# Patient Record
Sex: Female | Born: 1970 | Race: White | Hispanic: No | Smoking: Never smoker
Health system: Southern US, Community
[De-identification: ages and names within clinical notes are randomized; demographics above are authoritative.]

## PROBLEM LIST (undated history)

## (undated) DIAGNOSIS — Z789 Other specified health status: Secondary | ICD-10-CM

## (undated) DIAGNOSIS — E669 Obesity, unspecified: Secondary | ICD-10-CM

## (undated) DIAGNOSIS — T8859XA Other complications of anesthesia, initial encounter: Secondary | ICD-10-CM

## (undated) DIAGNOSIS — T4145XA Adverse effect of unspecified anesthetic, initial encounter: Secondary | ICD-10-CM

## (undated) HISTORY — PX: LAPAROSCOPIC ABDOMINAL EXPLORATION: SHX6249

## (undated) HISTORY — DX: Obesity, unspecified: E66.9

## (undated) HISTORY — PX: ABDOMINAL HYSTERECTOMY: SHX81

---

## 1997-09-02 ENCOUNTER — Inpatient Hospital Stay (HOSPITAL_COMMUNITY): Admission: AD | Admit: 1997-09-02 | Discharge: 1997-09-04 | Payer: Self-pay | Admitting: *Deleted

## 1997-10-07 ENCOUNTER — Other Ambulatory Visit: Admission: RE | Admit: 1997-10-07 | Discharge: 1997-10-07 | Payer: Self-pay | Admitting: *Deleted

## 1999-02-08 ENCOUNTER — Other Ambulatory Visit: Admission: RE | Admit: 1999-02-08 | Discharge: 1999-02-08 | Payer: Self-pay | Admitting: *Deleted

## 1999-08-10 ENCOUNTER — Encounter: Payer: Self-pay | Admitting: Obstetrics and Gynecology

## 1999-08-10 ENCOUNTER — Ambulatory Visit (HOSPITAL_COMMUNITY): Admission: RE | Admit: 1999-08-10 | Discharge: 1999-08-10 | Payer: Self-pay | Admitting: Obstetrics and Gynecology

## 1999-09-03 ENCOUNTER — Inpatient Hospital Stay (HOSPITAL_COMMUNITY): Admission: AD | Admit: 1999-09-03 | Discharge: 1999-09-05 | Payer: Self-pay | Admitting: *Deleted

## 1999-10-18 ENCOUNTER — Other Ambulatory Visit: Admission: RE | Admit: 1999-10-18 | Discharge: 1999-10-18 | Payer: Self-pay | Admitting: *Deleted

## 2000-10-21 ENCOUNTER — Other Ambulatory Visit: Admission: RE | Admit: 2000-10-21 | Discharge: 2000-10-21 | Payer: Self-pay | Admitting: *Deleted

## 2002-10-14 ENCOUNTER — Other Ambulatory Visit: Admission: RE | Admit: 2002-10-14 | Discharge: 2002-10-14 | Payer: Self-pay | Admitting: *Deleted

## 2003-08-24 ENCOUNTER — Other Ambulatory Visit: Admission: RE | Admit: 2003-08-24 | Discharge: 2003-08-24 | Payer: Self-pay | Admitting: Obstetrics & Gynecology

## 2004-07-10 ENCOUNTER — Other Ambulatory Visit: Admission: RE | Admit: 2004-07-10 | Discharge: 2004-07-10 | Payer: Self-pay | Admitting: Obstetrics & Gynecology

## 2004-08-17 ENCOUNTER — Ambulatory Visit (HOSPITAL_COMMUNITY): Admission: RE | Admit: 2004-08-17 | Discharge: 2004-08-17 | Payer: Self-pay | Admitting: Obstetrics & Gynecology

## 2005-05-10 ENCOUNTER — Ambulatory Visit (HOSPITAL_COMMUNITY): Admission: RE | Admit: 2005-05-10 | Discharge: 2005-05-11 | Payer: Self-pay | Admitting: Obstetrics & Gynecology

## 2005-05-10 ENCOUNTER — Encounter (INDEPENDENT_AMBULATORY_CARE_PROVIDER_SITE_OTHER): Payer: Self-pay | Admitting: Specialist

## 2009-12-26 ENCOUNTER — Inpatient Hospital Stay (HOSPITAL_COMMUNITY)
Admission: AD | Admit: 2009-12-26 | Discharge: 2009-12-26 | Payer: Self-pay | Source: Home / Self Care | Admitting: Obstetrics and Gynecology

## 2010-04-12 LAB — CBC
HCT: 36.3 % (ref 36.0–46.0)
Hemoglobin: 12.4 g/dL (ref 12.0–15.0)
MCH: 30.5 pg (ref 26.0–34.0)
MCHC: 34.1 g/dL (ref 30.0–36.0)
MCV: 89.4 fL (ref 78.0–100.0)
Platelets: 541 10*3/uL — ABNORMAL HIGH (ref 150–400)
RBC: 4.06 MIL/uL (ref 3.87–5.11)
RDW: 13.9 % (ref 11.5–15.5)
WBC: 11.2 10*3/uL — ABNORMAL HIGH (ref 4.0–10.5)

## 2010-06-16 NOTE — H&P (Signed)
Jean [name], Jean [name] NO.:  [id number]   MEDICAL RECORD NO.:  [id number]           PATIENT TYPE:   LOCATION:                                FACILITY:  [facility]   PHYSICIAN:  Freddy Finner, M.D.        DATE OF BIRTH:   DATE OF ADMISSION:  DATE OF DISCHARGE:                                HISTORY & PHYSICAL   ADMISSION DIAGNOSES:  1.  Chronic pelvic pain.  2.  Menorrhagia.  3.  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   HISTORY OF PRESENT ILLNESS:  The patient is a 40 year old white female,  gravida 2, para 2, who has been followed in our practice since 1997.  In  1998 she had diagnostic laparoscopy by Dr. Alan Mulder with findings of  pelvic endometriosis which were considered to be mild and were treated with  cautery ablation of the lesions.  She had hydrochrome intubation at the time  with bilateral tubal patency.  Under his direction she eventually conceived  and has carried two children to term and delivered them vaginally.  Within  the last year she has noted significant increasing chronic, primarily left-  sided, pelvic pain and had laparoscopy in July of 2006 with a finding of a  pseudo window at the level of the uterosacral ligament and old scarring  consistent with old endometriosis on the left pelvic sidewall.  These areas  were fulgurated.  In addition she had  uterosacral nerve ablation.  This has  failed to improve her symptoms.  She continues with very heavy, painful  menses and chronic left pelvic pain.  She has requested more definitive  surgical intervention.  She is admitted now for laparoscopically assisted  vaginal hysterectomy.   REVIEW OF SYSTEMS:  Her current review of systems is otherwise negative.   PAST MEDICAL HISTORY:  The patient has no known significant medical  illnesses.  She does have a history of an abnormal Pap smear, but a series  of recent Paps have been normal.   ALLERGIES:  1.  Her medication allergies are to MORPHINE.  2.   PERCOCET.   She has never had a blood transfusion.   PAST SURGICAL HISTORY:  In addition to those noted above, include surgery  for herniated disk in her back in 1991.   FAMILY HISTORY:  Remarkable for type 2 diabetes in her father who is diet  controlled, for a very strong family history of hypercholesterolemia.   PHYSICAL EXAMINATION:  HEENT:  Grossly within normal limits.  VITAL SIGNS:  Blood pressure in the office 108/70, weight 143, height 5 feet  7 inches.  NECK:  Thyroid gland is not palpably enlarged.  CHEST:  Clear to auscultation.  HEART:  Normal sinus rhythm without murmurs, rubs, or gallops.  BREASTS:  Considered to be normal.  No palpable masses.  No nipple  discharge.  No skin change.  ABDOMEN:  Soft and nontender without appreciable organomegaly or palpable  masses.  PELVIC:  External genitalia, vagina, and cervix are normal to inspection.  Most recent Pap test in May of 2006 was  normal.  Bimanual exam reveals the  uterus to be slightly enlarged, boggy to palpation, and mild to moderately  tender.  There are no palpable adnexal masses.  RECTAL:  The rectum is palpably normal.  Rectovaginal exam confirms the  above findings.   ASSESSMENT:  1.  Chronic pelvic pain, primarily left-sided.  2.  Menorrhagia.  3.  Severe dysmenorrhea.  4.  Documented history of pelvic endometriosis.   PLAN:  Laparoscopically assisted vaginal hysterectomy and bilateral salpingo-  oophorectomy only if absolutely essential.      W. Varney Baas, M.D.  Electronically Signed     WRN/MEDQ  D:  05/09/2005  T:  05/09/2005  Job:  [id number]

## 2010-06-16 NOTE — Op Note (Signed)
[name], Jean [name]                ACCOUNT NO.:  [id number]   MEDICAL RECORD NO.:  [id number]          PATIENT TYPE:  AMB   LOCATION:  SDC                           FACILITY:  [facility]   PHYSICIAN:  Freddy Finner, M.D.   DATE OF BIRTH:  1970/09/02   DATE OF PROCEDURE:  08/17/2004  DATE OF DISCHARGE:                                 OPERATIVE REPORT   PREOPERATIVE DIAGNOSES:  Chronic pelvic pain, known previous history of  pelvic endometriosis.   POSTOPERATIVE DIAGNOSES:  Chronic pelvic pain, known previous history of  pelvic endometriosis, minimal evidence of endometriosis, mottled slight  irregularity of uterus, pseudo window of peritoneum and cul-de-sac and along  the left uterosacral (all findings on peritoneum consistent with  endometriosis).   OPERATIVE PROCEDURE:  1.  Diagnostic laparoscopy.  2.  Fulguration of perimeters of pseudo windows peritoneum and pelvis and      scarred endometriotic like lesion of left pelvic side wall just above      the ureter and uterosacral nerve ablation with bipolar coagulation and      uterosacral ligaments.   SURGEON:  Dr. Jennette Kettle   ANESTHESIA:  General.   ESTIMATED INTRAOPERATIVE BLOOD LOSS:  Less than 10 mL.   INTRAOPERATIVE COMPLICATIONS:  None.   Patient is a 40 year old with known pelvic endometriosis diagnosed in 1998  at which time she was evaluated and treated for infertility.  In the recent  past she has had recurrent pelvic pain.  She has requested definitive  diagnosis and intervention.  Is admitted now for laparoscopy.   She was admitted on morning of surgery, brought to the operating room,  placed under adequate general anesthesia, placed in dorsal lithotomy  position.  Prep was carried out with Betadine solution in the usual fashion.  Bladder was evacuated with a Robinson catheter.  Hulka tenaculum was  attached to the cervix.  Sterile drapes were applied.  Two small incisions  were made, one at the umbilicus and one just  above the symphysis.  To the  upper incision an 11 mm nonbladed disposable trocar was introduced  bilaterally in the anterior abdominal wall manually.  Direct inspection  revealed adequate placement with __________  entry.  Pneumoperitoneum was  allowed to accumulate with carbon dioxide gas.  A 5 mm trocar was placed  through the lower incision under direct visualization __________ for  traction and exposure during the procedure.  A bipolar Kleppinger forceps  was then used to fulgurate the perimeter margins of the pseudo windows  identified above.  Also, a lesion along the left pelvic side wall.  Uterosacrals were grasped for retraction with the uterus and fulgurated with  the bipolar forceps.  Inspection of upper abdomen revealed no apparent  abnormality of liver, gallbladder, appendix.  Photographs were made of  intraoperative findings and retained in the office record.  At this point  the procedure was terminated.  Hemostasis was complete.  Gas was allowed to  escape from the abdomen.  Instruments were removed.  Skin incisions were  closed with interrupted subcuticular sutures of 3-0 Dexon.  Marcaine 0.5%  plain was injected through the incision sites for postoperative  analgesia.  The patient was given Toradol 30 mg IV, 30 mg IM at completion  of the procedure.  She was awakened and taken to recovery in good condition.  She will be given routine outpatient surgical instructions.  She was given  Percocet to be taken as needed for postoperative pain.       WRN/MEDQ  D:  08/17/2004  T:  08/17/2004  Job:  [id number]

## 2010-06-16 NOTE — Op Note (Signed)
Jean [name], Jean [name]                ACCOUNT NO.:  [id number]   MEDICAL RECORD NO.:  [id number]          PATIENT TYPE:  AMB   LOCATION:  [facility]                           FACILITY:  [facility]   PHYSICIAN:  Freddy Finner, M.D.   DATE OF BIRTH:  Dec 25, 1970   DATE OF PROCEDURE:  05/10/2005  DATE OF DISCHARGE:                                 OPERATIVE REPORT   PREOPERATIVE DIAGNOSES:  1.  Chronic pelvic pain.  2.  Menorrhagia.  3.  Known previous pelvic endometriosis.  4.  Suspected adenomyosis.   POSTOPERATIVE DIAGNOSES:  1.  Chronic pelvic pain.  2.  Menorrhagia.  3.  Known previous pelvic endometriosis.  4.  Suspected adenomyosis.  5.  No evidence of recurrent endometriosis.   SURGEON:  Dr. Jennette Kettle   ASSISTANT:  Dr. Edward Jolly   ANESTHESIA:  General.   ESTIMATED INTRAOPERATIVE BLOOD LOSS:  200 mL.   INTRAOPERATIVE COMPLICATIONS:  None.   The patient is a 41 year old who had laparoscopic in July of 2006 with the  finding of some recurrent minimal evidence of endometriosis.  She had  uterosacral nerve ablation at that time, has failed to have any significant  clinical improvement in her symptoms which are chronic pelvic pain primarily  on her left and menorrhagia and severe dysmenorrhea.  She is admitted now  for definitive surgery.   She was admitted on the morning of surgery.  She was given an IV bolus of  Rocephin.  She was placed in PAS hose.  She was brought to the operating  room, placed under adequate general anesthesia, placed in the dorsal  lithotomy position using the [place] stirrups system.  Betadine prep of  abdomen, perineum, and vagina was accomplished in the usual fashion using  Betadine scrub followed by Betadine solution.  Bladder evacuated with a  Robinson catheter using sterile technique.  Cervix was visualized and the  Hulka tenaculum attached without difficulty.  Sterile drapes were applied.  Two small incisions were made in the abdomen, one at the umbilicus through  an old scar and one just above the symphysis.  Through the upper incision an  11 mm non-bladed disposable trocar was introduced while elevating the  anterior abdominal wall manually.  Direct inspection with the laparoscope  revealed adequate placement with no evidence of injury on entry.  Pneumoperitoneum was allowed to accumulate with carbon dioxide gas.  A 5 mm  trocar was placed through the lower incision.  A systematic examination of  pelvic and abdominal contents was carried out.  Basically the findings  showed no evidence of recurrent active endometriosis, normal tubes and  ovaries.  The uterus is slightly irregular and a little boggy in appearance  consistent with adenomyosis.  The appendix was normal.  The liver was  normal.  No other abnormalities were noted within the abdominal or pelvic  cavity.  Using a blunt probe for traction through the lower trocar sleeve  and using the gyrus tripolar device at the operating port of the laparoscope  the utero-ovarian ligament, round ligament, and upper broad ligament was  progressively developed, sealed  with bipolar cautery, and sharply divided.  The dissection was carried down to the level just above the uterine  arteries.  Attention was then turned vaginally.  Posterior weighted vaginal  retractor was placed.  Hulka tenaculum was removed.  Christella Hartigan' tenaculum was  used.  Colpotomy incision was made while tenting the mucosa posterior to the  cervix.  Cervix was circumscribed with a scalpel to release the mucosa.  Uterosacral pedicles were then taken with the gyrus Heaney style bipolar  clamp.  Bladder pillars were similarly treated, sealed, and divided.  Bladder was carefully advanced off the cervix and the anterior peritoneum  entered.  Cardinal ligament pedicles and vesical pedicles were taken with  the gyrus device, sealed, and divided.  The uterus was delivered.  Remaining  small peritoneal pedicles were sealed and divided.  Angles of the  vagina  were anchored to the cuff with a mattress suture of 0 Monocryl.  Uterosacrals were plicated and the posterior peritoneum closed with  interrupted 0 Monocryl suture.  Cuff was closed vertically with figure-of-  eights of 0 Monocryl.  Foley catheter was placed.  Re-inspection  laparoscopically was then carried out using the [place] irrigation system  to irrigate the pelvis and to look for bleeding sources.  Several minor  bleeding sources were controlled with bipolar coagulation.  This produced  complete hemostasis as confirmed under irrigation and under reduced intra-  abdominal pressure.  Photographs were made before and after the  hysterectomy.  Those photographs were retained in the office record.  The  gas was then allowed to escape from the abdomen.  The irrigating solution  was aspirated from the abdomen, the instruments removed.  The skin incisions  were closed with interrupted subcuticular sutures of 3-0 Dexon.  Steri-  Strips applied to the lower incision.  0.5% Marcaine was injected through  the incision site for postoperative analgesia.  The patient was awakened,  taken to the recovery room in good condition.      Freddy Finner, M.D.  Electronically Signed     WRN/MEDQ  D:  05/10/2005  T:  05/10/2005  Job:  [id number]

## 2010-06-16 NOTE — Discharge Summary (Signed)
[name], [name]                ACCOUNT NO.:  [id number]   MEDICAL RECORD NO.:  [id number]          PATIENT TYPE:  OIB   LOCATION:  9317                          FACILITY:  [facility]   PHYSICIAN:  Freddy Finner, M.D.   DATE OF BIRTH:  06-29-1970   DATE OF ADMISSION:  05/10/2005  DATE OF DISCHARGE:  05/11/2005                                 DISCHARGE SUMMARY   DISCHARGE DIAGNOSIS:  Clinical symptoms of chronic pelvic pain, menorrhagia,  severe dysmenorrhea, histologic diagnosis of uterine adenomyosis, previously  documented but no evidence of recurrent endometriosis.   PROCEDURE:  Laparoscopic-assisted vaginal hysterectomy.   COMPLICATIONS:  There were no operative or postoperative complications.   LABORATORY DATA:  Hemoglobin 14.8 on admission and white count of 7500.  Admission prothrombin time, PTT, and INR were all normal.  Postoperative  hemoglobin was 12.6 and white count of 8.5.  Admission urinalysis was  normal.   HISTORY OF PRESENT ILLNESS:  For details of the present illness, past  history, family history, review of systems, and physical exam, are as  recorded in the admission note and are in the operative summary.  The  patient was admitted on the morning of surgery.  Her physical findings were  most remarkable for ultrasound findings consistent with adenomyosis and  clinical history consistent with this.  The uterus was slightly enlarged and  slightly tender by manual exam.   HOSPITAL COURSE:  The patient was admitted on the morning of surgery.  She  was given an IV antibiotics, placed in PAS hose, brought to the operating  room, and placed under adequate general anesthesia where the above-described  procedure was accomplished without difficulty.  There were no intraoperative  or postoperative complications.   By the evening of the first postoperative day, she was ambulating without  difficulty, having adequate bowel and bladder function.  She was discharged  home  with progressively increasing physical activity, with Percocet to be  taken as needed for postoperative pain.  She is to avoid vaginal entry or  heavy lifting.  She is to report heavy bleeding or fever.  She is to return  to the office in approximately two weeks for postoperative followup.      Freddy Finner, M.D.  Electronically Signed     WRN/MEDQ  D:  06/17/2005  T:  06/18/2005  Job:  [id number]

## 2012-08-25 ENCOUNTER — Other Ambulatory Visit: Payer: Self-pay | Admitting: Obstetrics & Gynecology

## 2012-08-25 DIAGNOSIS — R109 Unspecified abdominal pain: Secondary | ICD-10-CM

## 2012-08-26 ENCOUNTER — Ambulatory Visit
Admission: RE | Admit: 2012-08-26 | Discharge: 2012-08-26 | Disposition: A | Discharge status: Home / Self Care | Payer: Managed Care, Other (non HMO) | Source: Ambulatory Visit | Attending: Obstetrics & Gynecology | Admitting: Obstetrics & Gynecology

## 2012-08-26 DIAGNOSIS — R109 Unspecified abdominal pain: Secondary | ICD-10-CM

## 2012-08-26 MED ORDER — IOHEXOL 300 MG/ML  SOLN
100.0000 mL | Freq: Once | INTRAMUSCULAR | Status: AC | PRN
  Administered 2012-08-26: 100 mL via INTRAVENOUS

## 2012-08-27 ENCOUNTER — Other Ambulatory Visit: Payer: Self-pay

## 2013-04-22 ENCOUNTER — Inpatient Hospital Stay (HOSPITAL_COMMUNITY)
Admission: AD | Admit: 2013-04-22 | Discharge: 2013-04-22 | Disposition: A | Discharge status: Home / Self Care | Payer: Managed Care, Other (non HMO) | Source: Ambulatory Visit | Attending: Obstetrics and Gynecology | Admitting: Obstetrics and Gynecology

## 2013-04-22 ENCOUNTER — Encounter (HOSPITAL_COMMUNITY): Payer: Self-pay | Admitting: *Deleted

## 2013-04-22 DIAGNOSIS — N949 Unspecified condition associated with female genital organs and menstrual cycle: Secondary | ICD-10-CM | POA: Insufficient documentation

## 2013-04-22 DIAGNOSIS — N390 Urinary tract infection, site not specified: Secondary | ICD-10-CM

## 2013-04-22 DIAGNOSIS — M545 Low back pain, unspecified: Secondary | ICD-10-CM | POA: Insufficient documentation

## 2013-04-22 DIAGNOSIS — R319 Hematuria, unspecified: Secondary | ICD-10-CM | POA: Insufficient documentation

## 2013-04-22 HISTORY — DX: Other specified health status: Z78.9

## 2013-04-22 HISTORY — DX: Other complications of anesthesia, initial encounter: T88.59XA

## 2013-04-22 HISTORY — DX: Adverse effect of unspecified anesthetic, initial encounter: T41.45XA

## 2013-04-22 LAB — URINALYSIS, ROUTINE W REFLEX MICROSCOPIC
BILIRUBIN URINE: NEGATIVE
GLUCOSE, UA: 250 mg/dL — AB
KETONES UR: 15 mg/dL — AB
LEUKOCYTES UA: NEGATIVE
Nitrite: POSITIVE — AB
PH: 5.5 (ref 5.0–8.0)
Protein, ur: >300 mg/dL — AB
SPECIFIC GRAVITY, URINE: 1.02 (ref 1.005–1.030)
Urobilinogen, UA: >8 mg/dL — ABNORMAL HIGH (ref 0.0–1.0)

## 2013-04-22 LAB — URINE MICROSCOPIC-ADD ON

## 2013-04-22 MED ORDER — FLUCONAZOLE 150 MG PO TABS: 150.0000 mg | ORAL_TABLET | Freq: Every day | ORAL | Status: AC

## 2013-04-22 MED ORDER — PHENAZOPYRIDINE HCL 200 MG PO TABS: 200.0000 mg | ORAL_TABLET | Freq: Three times a day (TID) | ORAL | Status: AC | PRN

## 2013-04-22 MED ORDER — CIPROFLOXACIN HCL 500 MG PO TABS: 500.0000 mg | ORAL_TABLET | Freq: Two times a day (BID) | ORAL | Status: AC

## 2013-04-22 MED ORDER — PHENAZOPYRIDINE HCL 100 MG PO TABS
200.0000 mg | ORAL_TABLET | Freq: Once | ORAL | Status: AC
  Administered 2013-04-22: 200 mg via ORAL
  Filled 2013-04-22: qty 2

## 2013-04-22 MED ORDER — CIPROFLOXACIN HCL 500 MG PO TABS
500.0000 mg | ORAL_TABLET | Freq: Once | ORAL | Status: AC
  Administered 2013-04-22: 500 mg via ORAL
  Filled 2013-04-22: qty 1

## 2013-04-22 NOTE — Discharge Instructions (Signed)
Urinary Tract Infection  Urinary tract infections (UTIs) can develop anywhere along your urinary tract. Your urinary tract is your body's drainage system for removing wastes and extra water. Your urinary tract includes two kidneys, two ureters, a bladder, and a urethra. Your kidneys are a pair of bean-shaped organs. Each kidney is about the size of your fist. They are located below your ribs, one on each side of your spine.  CAUSES  Infections are caused by microbes, which are microscopic organisms, including fungi, viruses, and bacteria. These organisms are so small that they can only be seen through a microscope. Bacteria are the microbes that most commonly cause UTIs.  SYMPTOMS   Symptoms of UTIs may vary by age and gender of the patient and by the location of the infection. Symptoms in young women typically include a frequent and intense urge to urinate and a painful, burning feeling in the bladder or urethra during urination. Older women and men are more likely to be tired, shaky, and weak and have muscle aches and abdominal pain. A fever may mean the infection is in your kidneys. Other symptoms of a kidney infection include pain in your back or sides below the ribs, nausea, and vomiting.  DIAGNOSIS  To diagnose a UTI, your caregiver will ask you about your symptoms. Your caregiver also will ask to provide a urine sample. The urine sample will be tested for bacteria and white blood cells. White blood cells are made by your body to help fight infection.  TREATMENT   Typically, UTIs can be treated with medication. Because most UTIs are caused by a bacterial infection, they usually can be treated with the use of antibiotics. The choice of antibiotic and length of treatment depend on your symptoms and the type of bacteria causing your infection.  HOME CARE INSTRUCTIONS   If you were prescribed antibiotics, take them exactly as your caregiver instructs you. Finish the medication even if you feel better after you  have only taken some of the medication.   Drink enough water and fluids to keep your urine clear or pale yellow.   Avoid caffeine, tea, and carbonated beverages. They tend to irritate your bladder.   Empty your bladder often. Avoid holding urine for long periods of time.   Empty your bladder before and after sexual intercourse.   After a bowel movement, women should cleanse from front to back. Use each tissue only once.  SEEK MEDICAL CARE IF:    You have back pain.   You develop a fever.   Your symptoms do not begin to resolve within 3 days.  SEEK IMMEDIATE MEDICAL CARE IF:    You have severe back pain or lower abdominal pain.   You develop chills.   You have nausea or vomiting.   You have continued burning or discomfort with urination.  MAKE SURE YOU:    Understand these instructions.   Will watch your condition.   Will get help right away if you are not doing well or get worse.  Document Released: 10/25/2004 Document Revised: 07/17/2011 Document Reviewed: 02/23/2011  [employer] Patient Information 2014 [employer]

## 2013-04-22 NOTE — MAU Provider Note (Signed)
History     CSN: [id number]  Arrival date and time: 04/22/13 2200   First Provider Initiated Contact with Patient 04/22/13 2255      Chief Complaint  Patient presents with  . Urinary Tract Infection  . Hematuria   HPI Ms. Jean [name] is a 43 y.o. female who presents to MAU today with complaint of worsening UTI symptoms and hematuria. The patient was diagnosed with a UTI in the office and started on antibiotics on Tuesday. She states that pelvic pain and urinary complaints have worsened since then. She is on Macrobid and taking AZO tabs. She is having pelvic pain, low back pain, dysuria, frequency, urgency and hematuria. She denies flank pain or fever.    OB History   Grav Para Term Preterm Abortions TAB SAB Ect Mult Living                  Past Medical History  Diagnosis Date  . Medical history non-contributory   . Complication of anesthesia     Past Surgical History  Procedure Laterality Date  . Abdominal hysterectomy    . Laparoscopic abdominal exploration      Family History  Problem Relation Age of Onset  . Diabetes Father   . Hypertension Father     History  Substance Use Topics  . Smoking status: Never Smoker   . Smokeless tobacco: Not on file  . Alcohol Use: No    Allergies: Allergies no known allergies  Prescriptions prior to admission  Medication Sig Dispense Refill  . nitrofurantoin, macrocrystal-monohydrate, (MACROBID) 100 MG capsule Take 100 mg by mouth 2 (two) times daily.      . phenazopyridine (PYRIDIUM) 95 MG tablet Take 95 mg by mouth 3 (three) times daily as needed for pain.        Review of Systems  Constitutional: Positive for chills. Negative for fever and malaise/fatigue.  Gastrointestinal: Positive for abdominal pain.  Genitourinary: Positive for dysuria, urgency, frequency and hematuria. Negative for flank pain.   Physical Exam   Blood pressure 130/85, pulse 101, temperature 97.8 F (36.6 C), temperature source Oral, resp.  rate 16, height 5' 6.6" (1.692 m), weight 140 lb 3.2 oz (63.594 kg).  Physical Exam  Constitutional: She is oriented to person, place, and time. She appears well-developed and well-nourished. No distress.  HENT:  Head: Normocephalic and atraumatic.  Cardiovascular: Normal rate.   Respiratory: Effort normal.  GI: Soft. She exhibits no distension and no mass. There is tenderness (moderate tenderness to palpation of the lower abdomen). There is no rebound, no guarding and no CVA tenderness.  Neurological: She is alert and oriented to person, place, and time.  Skin: Skin is warm and dry. No erythema.  Psychiatric: She has a normal mood and affect.   Results for orders placed during the hospital encounter of 04/22/13 (from the past 24 hour(s))  URINALYSIS, ROUTINE W REFLEX MICROSCOPIC     Status: Abnormal   Collection Time    04/22/13 10:05 PM      Result Value Ref Range   Color, Urine ORANGE (*) YELLOW   APPearance CLOUDY (*) CLEAR   Specific Gravity, Urine 1.020  1.005 - 1.030   pH 5.5  5.0 - 8.0   Glucose, UA 250 (*) NEGATIVE mg/dL   Hgb urine dipstick LARGE (*) NEGATIVE   Bilirubin Urine NEGATIVE  NEGATIVE   Ketones, ur 15 (*) NEGATIVE mg/dL   Protein, ur >409>300 (*) NEGATIVE mg/dL   Urobilinogen, UA >8.1>8.0 (*) 0.0 -  1.0 mg/dL   Nitrite POSITIVE (*) NEGATIVE   Leukocytes, UA NEGATIVE  NEGATIVE  URINE MICROSCOPIC-ADD ON     Status: None   Collection Time    04/22/13 10:05 PM      Result Value Ref Range   Squamous Epithelial / LPF RARE  RARE   WBC, UA 0-2  <3 WBC/hpf   RBC / HPF 21-50  <3 RBC/hpf   Bacteria, UA RARE  RARE    MAU Course  Procedures None  MDM Discussed with Dr. Thana Ates. Discharge with Rx for Cipro and Pyridium. Give first dose in MAU tonight.  Requests Diflucan. Usually get yeast after abx Assessment and Plan  A: UTI  P: Discharge home Fx for Cipro, Pyridium and Diflucan given Warning signs for pyelonephritis discussed Patient to follow-up in the office  PRN Patient may return to MAU as needed or if her condition were to change or worsen  Freddi Starr, PA-C  04/22/2013, 10:55 PM

## 2013-04-22 NOTE — MAU Note (Signed)
Pt started taking macrobid and AZO for UTI on 3/24, no relief.  Today noticed blood clots in urine. Hx of partial hysterectomy.

## 2013-04-24 LAB — URINE CULTURE
CULTURE: NO GROWTH
Colony Count: NO GROWTH

## 2013-12-02 ENCOUNTER — Other Ambulatory Visit: Payer: Self-pay | Admitting: Obstetrics & Gynecology

## 2013-12-03 LAB — CYTOLOGY - PAP

## 2019-05-11 ENCOUNTER — Ambulatory Visit: Payer: Managed Care, Other (non HMO) | Admitting: Dermatology

## 2019-07-14 ENCOUNTER — Ambulatory Visit: Payer: BC Managed Care – PPO | Admitting: Dermatology

## 2019-07-14 ENCOUNTER — Other Ambulatory Visit: Payer: Self-pay

## 2019-07-14 DIAGNOSIS — L820 Inflamed seborrheic keratosis: Secondary | ICD-10-CM | POA: Diagnosis not present

## 2019-07-14 DIAGNOSIS — L578 Other skin changes due to chronic exposure to nonionizing radiation: Secondary | ICD-10-CM

## 2019-07-14 DIAGNOSIS — L219 Seborrheic dermatitis, unspecified: Secondary | ICD-10-CM

## 2019-07-14 DIAGNOSIS — L821 Other seborrheic keratosis: Secondary | ICD-10-CM | POA: Diagnosis not present

## 2019-07-14 NOTE — Patient Instructions (Signed)
Recommend daily broad spectrum sunscreen SPF 30+ to sun-exposed areas, reapply every 2 hours as needed. Call for new or changing lesions.  Cryotherapy Aftercare  . Wash gently with soap and water everyday.   . Apply Vaseline and Band-Aid daily until healed.  

## 2019-07-14 NOTE — Progress Notes (Signed)
   Follow-Up Visit   Subjective  Jean [name] is a 49 y.o. female who presents for the following: Skin Problem.  Patient here today for a spot on left lower leg. Present for about 3-4 months, she would cut it when shaving and it would bleed then scab. There is just a white spot there now and occasionally itches. There is also a few spots on her feet and legs she would like checked. The spot on her R foot itches.  She also has another irritated spot on her R arm.  No history of skin cancer.  Patient is also using TMC 0.1% cream for scaly rash of the R ear.  Rash on ear has improved.  The following portions of the chart were reviewed this encounter and updated as appropriate:      Review of Systems:  No other skin or systemic complaints except as noted in HPI or Assessment and Plan.  Objective  Well appearing patient in no apparent distress; mood and affect are within normal limits.  A focused examination was performed including feet, legs, arms, ears. Relevant physical exam findings are noted in the Assessment and Plan.  Objective  right foot dorsum x 1, right flexor forearm x 1 (2): Erythematous keratotic waxy stuck-on papule  Objective  Left pretibia, left mid pretibia: Waxy flesh papule on left pretibia, 2.5cm waxy tan patch on left mid pretibia  Objective  Right Ear anithelix: Pink patch with mild scale- improved per pt, she preferentially sleeps on R side   Assessment & Plan    Inflamed seborrheic keratosis (2) right foot dorsum x 1, right flexor forearm x 1  Cryotherapy today   Destruction of lesion - right foot dorsum x 1, right flexor forearm x 1  Destruction method: cryotherapy   Informed consent: discussed and consent obtained   Lesion destroyed using liquid nitrogen: Yes   Region frozen until ice ball extended beyond lesion: Yes   Outcome: patient tolerated procedure well with no complications   Post-procedure details: wound care instructions given     Seborrheic keratosis Left pretibia, left mid pretibia  Benign, observe.    Seborrheic dermatitis Right Ear anithelix  Vrs CD[facility]- improved Cont TMC 0.1% cream QD/BID PRN flare rash/itch. Avoid F/G/A. Advised not to use once resolved. May also try avoiding pressure on ear while sleeping   Actinic Damage - diffuse scaly erythematous macules with underlying dyspigmentation - Recommend daily broad spectrum sunscreen SPF 30+ to sun-exposed areas, reapply every 2 hours as needed.  - Call for new or changing lesions.  Return if symptoms worsen or fail to improve.  Anise Salvo, RMA, am acting as scribe for Willeen Niece, MD .  Documentation: I have reviewed the above documentation for accuracy and completeness, and I agree with the above.  Willeen Niece MD

## 2020-07-14 ENCOUNTER — Other Ambulatory Visit: Payer: Self-pay | Admitting: Obstetrics & Gynecology

## 2020-07-14 DIAGNOSIS — R928 Other abnormal and inconclusive findings on diagnostic imaging of breast: Secondary | ICD-10-CM

## 2020-07-15 ENCOUNTER — Ambulatory Visit
Admission: RE | Admit: 2020-07-15 | Discharge: 2020-07-15 | Disposition: A | Discharge status: Home / Self Care | Payer: BC Managed Care – PPO | Source: Ambulatory Visit | Attending: Obstetrics & Gynecology | Admitting: Obstetrics & Gynecology

## 2020-07-15 ENCOUNTER — Other Ambulatory Visit: Payer: Self-pay

## 2020-07-15 DIAGNOSIS — R928 Other abnormal and inconclusive findings on diagnostic imaging of breast: Secondary | ICD-10-CM

## 2021-03-27 ENCOUNTER — Other Ambulatory Visit: Payer: Self-pay | Admitting: Obstetrics and Gynecology

## 2021-10-18 IMAGING — MG MM DIGITAL DIAGNOSTIC UNILAT*R* W/ TOMO W/ CAD
6 series · 6 of 18 positions shown · non-contrast
Comparison: Previous exam(s).

CLINICAL DATA: 50-year-old female presenting as a recall from
screening for possible right breast asymmetry.

EXAM:
DIGITAL DIAGNOSTIC UNILATERAL RIGHT MAMMOGRAM WITH TOMOSYNTHESIS AND
CAD; ULTRASOUND RIGHT BREAST LIMITED
TECHNIQUE: Right digital diagnostic mammography and breast tomosynthesis was
performed. The images were evaluated with computer-aided detection.;
Targeted ultrasound examination of the right breast was performed

[R CC synth-2D (1 of 2)]
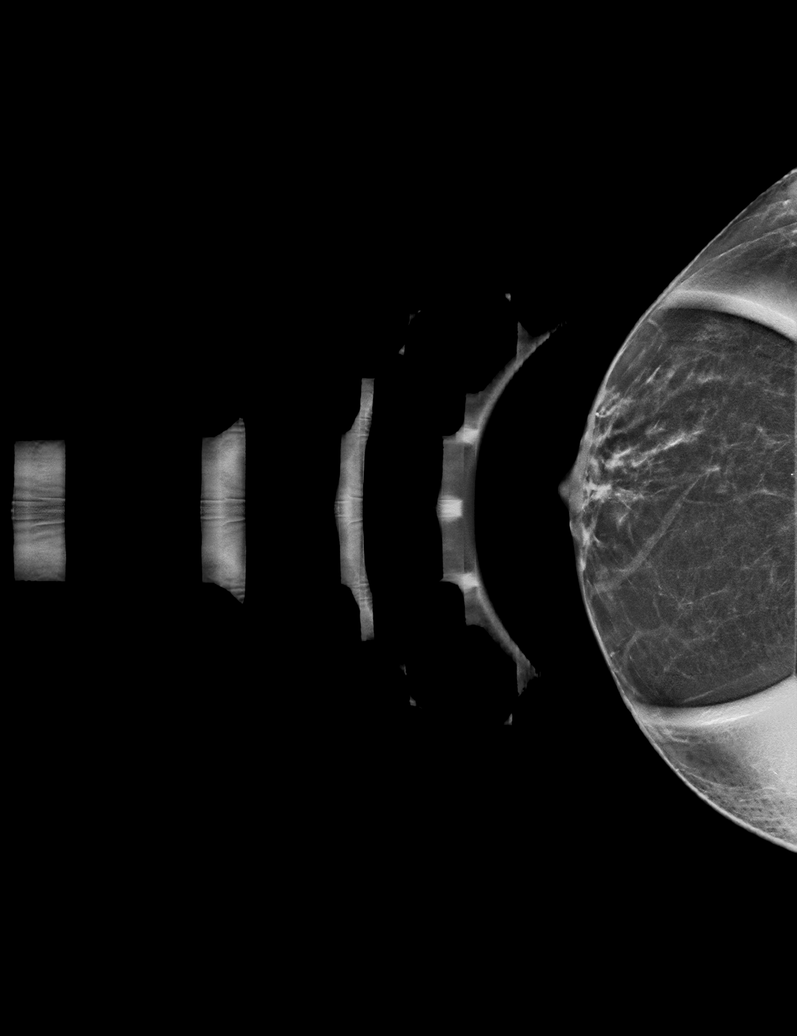

[R CC synth-2D (2 of 2)]
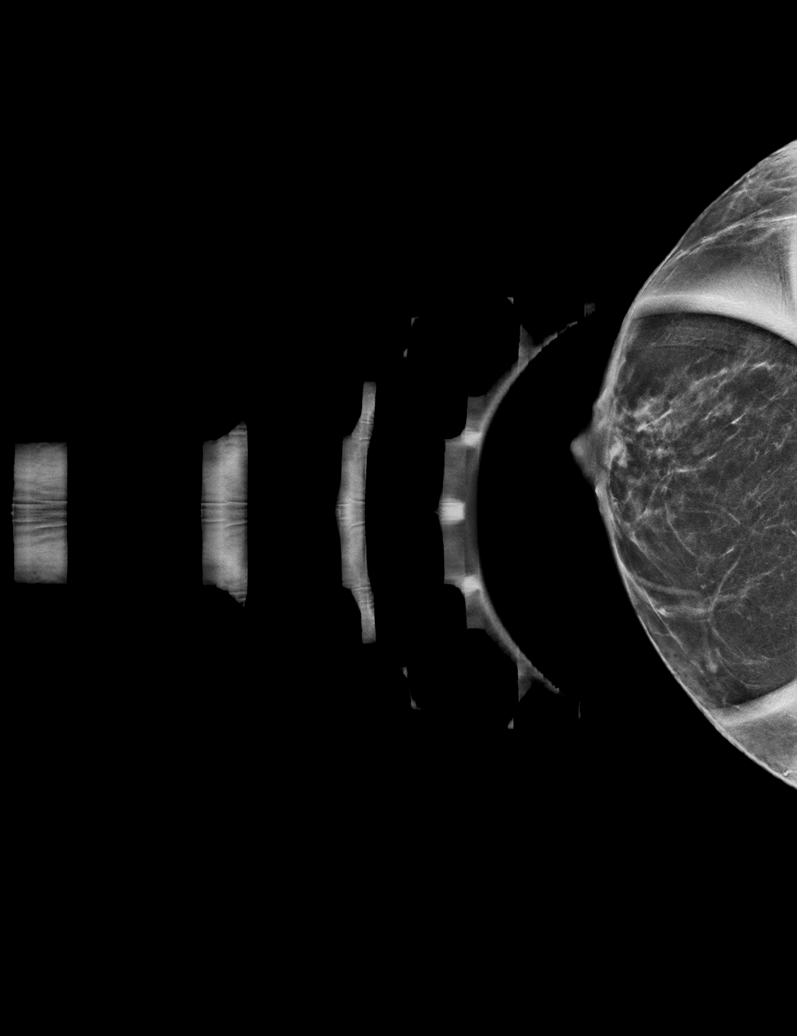

[R ML synth-2D]
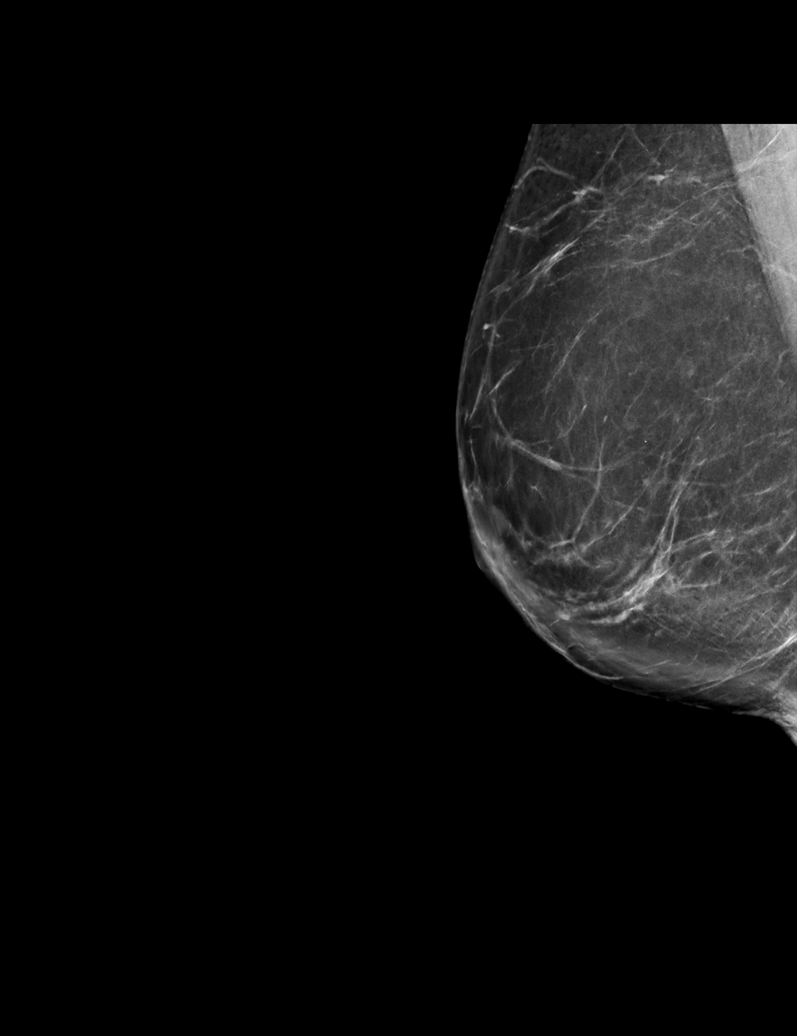

[R ML tomo · tomo slice 41/82.0]
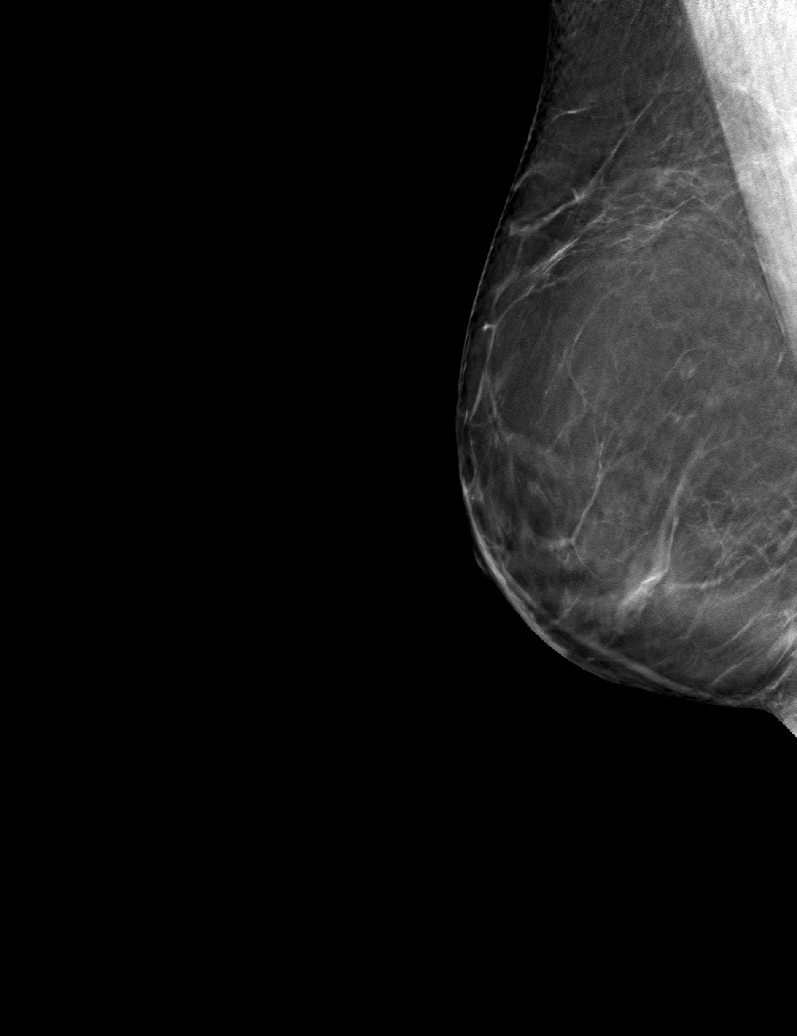

[R CC tomo (1 of 2) · tomo slice 25/49.0]
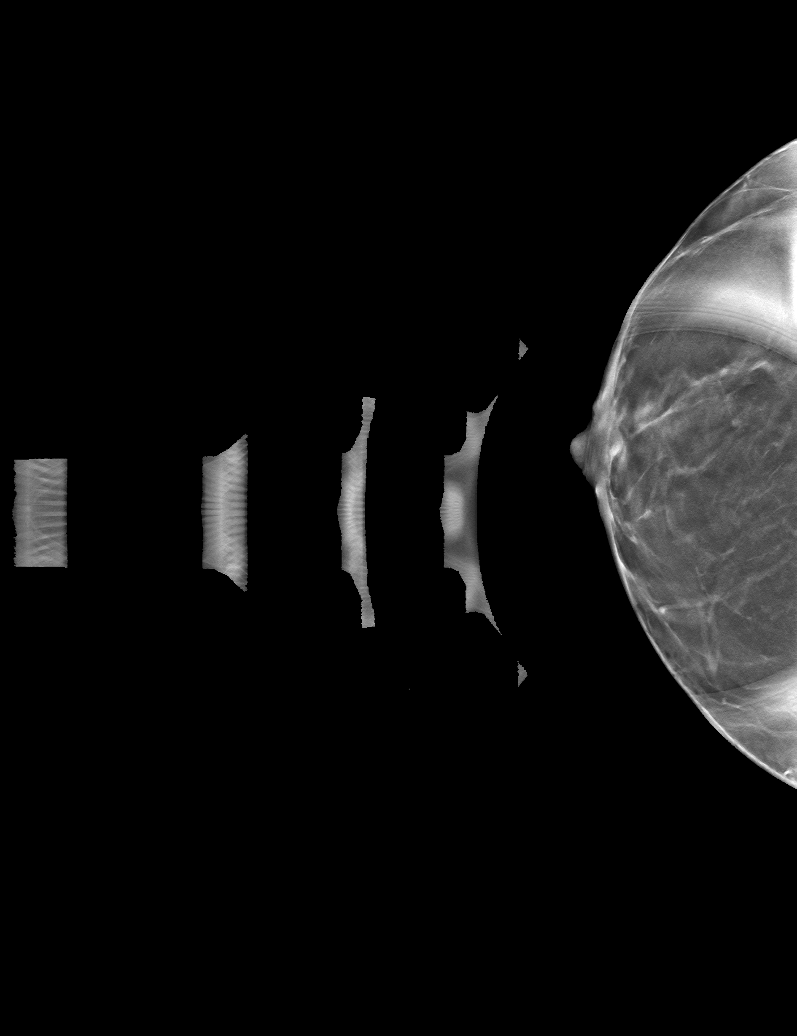

[R CC tomo (2 of 2) · tomo slice 25/49.0]
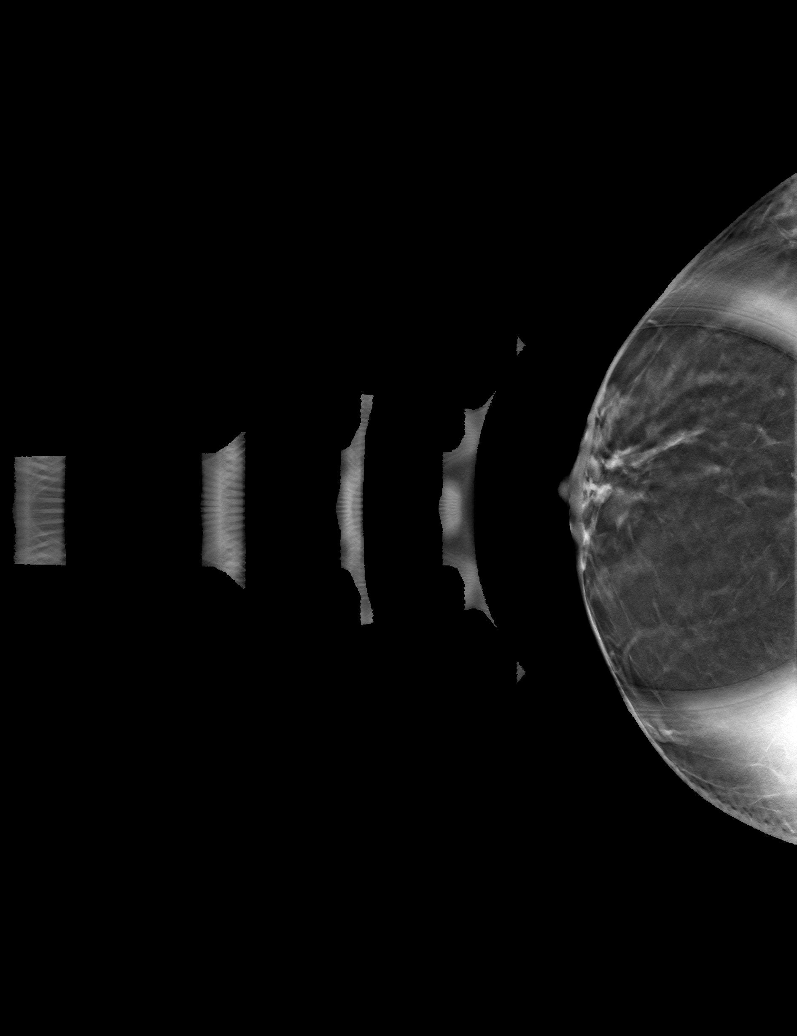

[6 of 18 positions shown; findings below may reference images not displayed]

ACR Breast Density Category b: There are scattered areas of
fibroglandular density.
FINDINGS: Mammogram:

Spot compression tomosynthesis cc, rolled spot compression
tomosynthesis cc and full field mL tomosynthesis views of the right
breast were performed. There is a persistent small
asymmetry/possible mass in the retroareolar right breast measuring
0.7 cm.

Ultrasound:

Targeted ultrasound performed in the retroareolar/12 o'clock right
breast demonstrating an oval circumscribed anechoic mass measuring
0.9 x 0.3 x 0.6 cm, consistent with a benign simple cyst. This
corresponds to the mammographic finding.
IMPRESSION: Benign simple cyst in the right breast at 12 o'clock retroareolar.

RECOMMENDATION:
Screening mammogram in one year.(Code:[id number]-[id number])

I have discussed the findings and recommendations with the patient.
If applicable, a reminder letter will be sent to the patient
regarding the next appointment.

BI-RADS CATEGORY  2: Benign.

## 2022-10-16 ENCOUNTER — Encounter: Payer: Self-pay | Admitting: Internal Medicine

## 2023-02-07 ENCOUNTER — Ambulatory Visit: Payer: Self-pay | Admitting: Internal Medicine

## 2023-12-05 ENCOUNTER — Encounter: Payer: Self-pay | Admitting: Internal Medicine

## 2023-12-16 ENCOUNTER — Other Ambulatory Visit: Payer: Self-pay | Admitting: Obstetrics and Gynecology

## 2023-12-16 DIAGNOSIS — R928 Other abnormal and inconclusive findings on diagnostic imaging of breast: Secondary | ICD-10-CM

## 2024-02-06 ENCOUNTER — Ambulatory Visit: Admitting: Internal Medicine

## 2024-04-16 ENCOUNTER — Ambulatory Visit: Admitting: Internal Medicine
